# Patient Record
Sex: Male | Born: 1960 | Race: White | Hispanic: No | Marital: Married | Smoking: Never smoker
Health system: Southern US, Community
[De-identification: ages and names within clinical notes are randomized; demographics above are authoritative.]

## PROBLEM LIST (undated history)

## (undated) DIAGNOSIS — I10 Essential (primary) hypertension: Secondary | ICD-10-CM

## (undated) DIAGNOSIS — F4310 Post-traumatic stress disorder, unspecified: Secondary | ICD-10-CM

## (undated) DIAGNOSIS — Z974 Presence of external hearing-aid: Secondary | ICD-10-CM

## (undated) DIAGNOSIS — M1990 Unspecified osteoarthritis, unspecified site: Secondary | ICD-10-CM

## (undated) HISTORY — PX: COLONOSCOPY: SHX174

## (undated) HISTORY — PX: OTHER SURGICAL HISTORY: SHX169

## (undated) HISTORY — PX: TONSILLECTOMY: SUR1361

## (undated) HISTORY — PX: JOINT REPLACEMENT: SHX530

---

## 2017-03-10 ENCOUNTER — Emergency Department: Payer: Non-veteran care

## 2017-03-10 ENCOUNTER — Emergency Department
Admission: EM | Admit: 2017-03-10 | Discharge: 2017-03-10 | Disposition: A | Discharge status: Home / Self Care | Payer: Non-veteran care | Attending: Emergency Medicine | Admitting: Emergency Medicine

## 2017-03-10 ENCOUNTER — Encounter: Payer: Self-pay | Admitting: Emergency Medicine

## 2017-03-10 DIAGNOSIS — S46211A Strain of muscle, fascia and tendon of other parts of biceps, right arm, initial encounter: Secondary | ICD-10-CM | POA: Diagnosis not present

## 2017-03-10 DIAGNOSIS — I10 Essential (primary) hypertension: Secondary | ICD-10-CM | POA: Diagnosis not present

## 2017-03-10 DIAGNOSIS — Y9389 Activity, other specified: Secondary | ICD-10-CM | POA: Diagnosis not present

## 2017-03-10 DIAGNOSIS — Y929 Unspecified place or not applicable: Secondary | ICD-10-CM | POA: Diagnosis not present

## 2017-03-10 DIAGNOSIS — S46219A Strain of muscle, fascia and tendon of other parts of biceps, unspecified arm, initial encounter: Secondary | ICD-10-CM

## 2017-03-10 DIAGNOSIS — Y999 Unspecified external cause status: Secondary | ICD-10-CM | POA: Diagnosis not present

## 2017-03-10 DIAGNOSIS — W501XXA Accidental kick by another person, initial encounter: Secondary | ICD-10-CM | POA: Diagnosis not present

## 2017-03-10 DIAGNOSIS — S4991XA Unspecified injury of right shoulder and upper arm, initial encounter: Secondary | ICD-10-CM | POA: Diagnosis present

## 2017-03-10 DIAGNOSIS — Z96653 Presence of artificial knee joint, bilateral: Secondary | ICD-10-CM | POA: Diagnosis not present

## 2017-03-10 DIAGNOSIS — Z79899 Other long term (current) drug therapy: Secondary | ICD-10-CM | POA: Diagnosis not present

## 2017-03-10 HISTORY — DX: Essential (primary) hypertension: I10

## 2017-03-10 HISTORY — DX: Post-traumatic stress disorder, unspecified: F43.10

## 2017-03-10 MED ORDER — OXYCODONE-ACETAMINOPHEN 5-325 MG PO TABS: 1.0000 | ORAL_TABLET | ORAL | 0 refills | Status: DC | PRN

## 2017-03-10 NOTE — ED Notes (Signed)
See triage note  States playing with grand child this am  As kicked to left arm  Increased pain with movement  pain to anterior area  Positive pulses noted
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
## 2017-03-10 NOTE — Discharge Instructions (Signed)
If you have not heard from Dr. Binnie RailPoggi's office by this afternoon, please call his office. Return to the ER for any new symptoms of concern if unable to see orthopedics.

## 2017-03-10 NOTE — ED Notes (Signed)
Patient transported to MRI 

## 2017-03-10 NOTE — ED Notes (Signed)
Taken to MRI by EDT

## 2017-03-10 NOTE — ED Triage Notes (Signed)
left elbow injury.  Patient was wrestling with 56 year old grandson this morning when grandson did a "flying ninja kick".  Patient went to catch grandson and felt a "rip" to anterior medical elbow/ forearm area.  + CMS.  C/O increased pain with extension and any lifting.

## 2017-03-12 ENCOUNTER — Encounter: Payer: Self-pay | Admitting: *Deleted

## 2017-03-18 ENCOUNTER — Encounter
Admission: RE | Disposition: A | Discharge status: Home / Self Care | Payer: Self-pay | Source: Ambulatory Visit | Attending: Surgery

## 2017-03-18 ENCOUNTER — Ambulatory Visit: Payer: Non-veteran care | Admitting: Anesthesiology

## 2017-03-18 ENCOUNTER — Ambulatory Visit
Admission: RE | Admit: 2017-03-18 | Discharge: 2017-03-18 | Disposition: A | Discharge status: Home / Self Care | Payer: Non-veteran care | Source: Ambulatory Visit | Attending: Surgery | Admitting: Surgery

## 2017-03-18 ENCOUNTER — Encounter: Payer: Self-pay | Admitting: *Deleted

## 2017-03-18 DIAGNOSIS — Z79899 Other long term (current) drug therapy: Secondary | ICD-10-CM | POA: Insufficient documentation

## 2017-03-18 DIAGNOSIS — S46112A Strain of muscle, fascia and tendon of long head of biceps, left arm, initial encounter: Secondary | ICD-10-CM | POA: Diagnosis not present

## 2017-03-18 DIAGNOSIS — Z96653 Presence of artificial knee joint, bilateral: Secondary | ICD-10-CM | POA: Diagnosis not present

## 2017-03-18 DIAGNOSIS — Z791 Long term (current) use of non-steroidal anti-inflammatories (NSAID): Secondary | ICD-10-CM | POA: Diagnosis not present

## 2017-03-18 DIAGNOSIS — X501XXA Overexertion from prolonged static or awkward postures, initial encounter: Secondary | ICD-10-CM | POA: Diagnosis not present

## 2017-03-18 DIAGNOSIS — I10 Essential (primary) hypertension: Secondary | ICD-10-CM | POA: Insufficient documentation

## 2017-03-18 HISTORY — PX: DISTAL BICEPS TENDON REPAIR: SHX1461

## 2017-03-18 HISTORY — DX: Presence of external hearing-aid: Z97.4

## 2017-03-18 HISTORY — DX: Unspecified osteoarthritis, unspecified site: M19.90

## 2017-03-18 SURGERY — REPAIR, TENDON, BICEPS, DISTAL
Anesthesia: Monitor Anesthesia Care | Laterality: Left | Wound class: Clean

## 2017-03-18 MED ORDER — MIDAZOLAM HCL 5 MG/5ML IJ SOLN
INTRAMUSCULAR | Status: DC | PRN
  Administered 2017-03-18: 2 mg via INTRAVENOUS
  Administered 2017-03-18 (×2): 0.5 mg via INTRAVENOUS
  Administered 2017-03-18: 1 mg via INTRAVENOUS

## 2017-03-18 MED ORDER — OXYCODONE HCL 5 MG/5ML PO SOLN: 5.0000 mg | Freq: Once | ORAL | Status: DC | PRN

## 2017-03-18 MED ORDER — ACETAMINOPHEN 160 MG/5ML PO SOLN: 325.0000 mg | ORAL | Status: DC | PRN

## 2017-03-18 MED ORDER — FENTANYL CITRATE (PF) 100 MCG/2ML IJ SOLN: 25.0000 ug | INTRAMUSCULAR | Status: DC | PRN

## 2017-03-18 MED ORDER — PROMETHAZINE HCL 25 MG/ML IJ SOLN: 6.2500 mg | INTRAMUSCULAR | Status: DC | PRN

## 2017-03-18 MED ORDER — OXYCODONE HCL 5 MG PO TABS: 5.0000 mg | ORAL_TABLET | ORAL | 0 refills | Status: AC | PRN

## 2017-03-18 MED ORDER — ONDANSETRON HCL 4 MG/2ML IJ SOLN
INTRAMUSCULAR | Status: DC | PRN
  Administered 2017-03-18: 4 mg via INTRAVENOUS

## 2017-03-18 MED ORDER — EPHEDRINE SULFATE 50 MG/ML IJ SOLN
INTRAMUSCULAR | Status: DC | PRN
  Administered 2017-03-18: 10 mg via INTRAVENOUS
  Administered 2017-03-18 (×2): 5 mg via INTRAVENOUS
  Administered 2017-03-18 (×3): 10 mg via INTRAVENOUS

## 2017-03-18 MED ORDER — BUPIVACAINE HCL (PF) 0.5 % IJ SOLN
INTRAMUSCULAR | Status: DC | PRN
  Administered 2017-03-18: 10 mL

## 2017-03-18 MED ORDER — LACTATED RINGERS IV SOLN
10.0000 mL/h | INTRAVENOUS | Status: DC
  Administered 2017-03-18 (×2): via INTRAVENOUS
  Administered 2017-03-18: 10 mL/h via INTRAVENOUS

## 2017-03-18 MED ORDER — CEFAZOLIN SODIUM 1 G IJ SOLR
2000.0000 mg | Freq: Once | INTRAMUSCULAR | Status: AC
  Administered 2017-03-18: 2000 mg via INTRAVENOUS

## 2017-03-18 MED ORDER — ACETAMINOPHEN 325 MG PO TABS: 325.0000 mg | ORAL_TABLET | ORAL | Status: DC | PRN

## 2017-03-18 MED ORDER — PROPOFOL 500 MG/50ML IV EMUL
INTRAVENOUS | Status: DC | PRN
Start: 2017-03-18 — End: 2017-03-18
  Administered 2017-03-18: 120 ug/kg/min via INTRAVENOUS

## 2017-03-18 MED ORDER — FENTANYL CITRATE (PF) 100 MCG/2ML IJ SOLN
INTRAMUSCULAR | Status: DC | PRN
  Administered 2017-03-18: 100 ug via INTRAVENOUS

## 2017-03-18 MED ORDER — OXYCODONE HCL 5 MG PO TABS: 5.0000 mg | ORAL_TABLET | Freq: Once | ORAL | Status: DC | PRN

## 2017-03-18 MED ORDER — ROPIVACAINE HCL 5 MG/ML IJ SOLN
INTRAMUSCULAR | Status: DC | PRN
  Administered 2017-03-18: 30 mL via PERINEURAL

## 2017-03-18 SURGICAL SUPPLY — 39 items
BANDAGE ACE 4X5 VEL STRL LF (GAUZE/BANDAGES/DRESSINGS) ×6 IMPLANT
BNDG COHESIVE 4X5 TAN STRL (GAUZE/BANDAGES/DRESSINGS) ×3 IMPLANT
BNDG ESMARK 4X12 TAN STRL LF (GAUZE/BANDAGES/DRESSINGS) ×3 IMPLANT
CANISTER SUCT 1200ML W/VALVE (MISCELLANEOUS) ×3 IMPLANT
CHLORAPREP W/TINT 26ML (MISCELLANEOUS) ×3 IMPLANT
CLOSURE WOUND 1/2 X4 (GAUZE/BANDAGES/DRESSINGS) ×1
CUFF TOURN SGL QUICK 18 (TOURNIQUET CUFF) ×3 IMPLANT
CUFF TOURN SGL QUICK 24 (TOURNIQUET CUFF)
CUFF TRNQT CYL 24X4X40X1 (TOURNIQUET CUFF) IMPLANT
DRAPE FLUOR MINI C-ARM 54X84 (DRAPES) ×3 IMPLANT
DRAPE U-SHAPE 48X52 POLY STRL (PACKS) ×3 IMPLANT
ELECT REM PT RETURN 9FT ADLT (ELECTROSURGICAL) ×3
ELECTRODE REM PT RTRN 9FT ADLT (ELECTROSURGICAL) ×1 IMPLANT
GAUZE PETRO XEROFOAM 1X8 (MISCELLANEOUS) ×3 IMPLANT
GAUZE SPONGE 4X4 12PLY STRL (GAUZE/BANDAGES/DRESSINGS) ×3 IMPLANT
GLOVE BIO SURGEON STRL SZ8 (GLOVE) ×3 IMPLANT
GLOVE INDICATOR 8.0 STRL GRN (GLOVE) ×3 IMPLANT
GOWN STRL REUS W/ TWL LRG LVL3 (GOWN DISPOSABLE) ×1 IMPLANT
GOWN STRL REUS W/ TWL XL LVL3 (GOWN DISPOSABLE) ×1 IMPLANT
GOWN STRL REUS W/TWL LRG LVL3 (GOWN DISPOSABLE) ×2
GOWN STRL REUS W/TWL XL LVL3 (GOWN DISPOSABLE) ×2
IMPL TOGGLELOC ELBOW SYSTEM (Orthopedic Implant) ×1 IMPLANT
IMPLANT TOGGLELOC ELBOW SYSTEM (Orthopedic Implant) ×3 IMPLANT
KIT ROOM TURNOVER OR (KITS) ×3 IMPLANT
NS IRRIG 500ML POUR BTL (IV SOLUTION) ×3 IMPLANT
PACK EXTREMITY ARMC (MISCELLANEOUS) ×3 IMPLANT
PAD CAST CTTN 4X4 STRL (SOFTGOODS) ×1 IMPLANT
PADDING CAST COTTON 4X4 STRL (SOFTGOODS) ×2
SLING ARM LRG DEEP (SOFTGOODS) ×3 IMPLANT
SLING ARM M TX990204 (SOFTGOODS) IMPLANT
SLING ARM S TX990203 (SOFTGOODS) IMPLANT
SPLINT CAST 1 STEP 4X30 (MISCELLANEOUS) ×3 IMPLANT
STRIP CLOSURE SKIN 1/2X4 (GAUZE/BANDAGES/DRESSINGS) ×2 IMPLANT
SUT ORTHOCORD OS-6 NDL 36 (SUTURE) ×3 IMPLANT
SUT VIC AB 2-0 SH 27 (SUTURE) ×2
SUT VIC AB 2-0 SH 27XBRD (SUTURE) ×1 IMPLANT
SUT VIC AB 3-0 SH 27 (SUTURE) ×2
SUT VIC AB 3-0 SH 27X BRD (SUTURE) ×1 IMPLANT
SWABSTK COMLB BENZOIN TINCTURE (MISCELLANEOUS) ×3 IMPLANT

## 2017-03-18 NOTE — Anesthesia Preprocedure Evaluation (Signed)
Anesthesia Evaluation  Patient identified by MRN, date of birth, ID band Patient awake    Reviewed: Allergy & Precautions, NPO status , Unable to perform ROS - Chart review only  History of Anesthesia Complications (+) PONV  Airway Mallampati: I  TM Distance: >3 FB Neck ROM: Full    Dental  (+) Teeth Intact   Pulmonary neg pulmonary ROS,    breath sounds clear to auscultation       Cardiovascular hypertension,  Rhythm:Regular Rate:Normal     Neuro/Psych PSYCHIATRIC DISORDERS (PTSD) negative neurological ROS     GI/Hepatic   Endo/Other    Renal/GU      Musculoskeletal  (+) Arthritis ,   Abdominal   Peds  Hematology   Anesthesia Other Findings   Reproductive/Obstetrics                            Anesthesia Physical Anesthesia Plan  ASA: II  Anesthesia Plan: Regional and MAC   Post-op Pain Management:    Induction:   PONV Risk Score and Plan: 2 and Ondansetron  Airway Management Planned:   Additional Equipment:   Intra-op Plan:   Post-operative Plan:   Informed Consent: I have reviewed the patients History and Physical, chart, labs and discussed the procedure including the risks, benefits and alternatives for the proposed anesthesia with the patient or authorized representative who has indicated his/her understanding and acceptance.     Plan Discussed with: Anesthesiologist  Anesthesia Plan Comments:         Anesthesia Quick Evaluation

## 2017-03-18 NOTE — Anesthesia Postprocedure Evaluation (Signed)
Anesthesia Post Note  Patient: Ethan Dominguez  Procedure(s) Performed: Procedure(s) (LRB): LEFT DISTAL BICEPS REPAIR (Left)  Patient location during evaluation: PACU Anesthesia Type: Regional and MAC Level of consciousness: awake and alert Pain management: pain level controlled Vital Signs Assessment: post-procedure vital signs reviewed and stable Respiratory status: spontaneous breathing, nonlabored ventilation and respiratory function stable Cardiovascular status: stable Anesthetic complications: no    Veda Canning

## 2017-03-18 NOTE — H&P (Signed)
Paper H&P to be scanned into permanent record. H&P reviewed and patient re-examined. No changes. 

## 2017-03-18 NOTE — Discharge Instructions (Signed)
General Anesthesia, Adult, Care After These instructions provide you with information about caring for yourself after your procedure. Your health care provider may also give you more specific instructions. Your treatment has been planned according to current medical practices, but problems sometimes occur. Call your health care provider if you have any problems or questions after your procedure. What can I expect after the procedure? After the procedure, it is common to have:  Vomiting.  A sore throat.  Mental slowness.  It is common to feel:  Nauseous.  Cold or shivery.  Sleepy.  Tired.  Sore or achy, even in parts of your body where you did not have surgery.  Follow these instructions at home: For at least 24 hours after the procedure:  Do not: ? Participate in activities where you could fall or become injured. ? Drive. ? Use heavy machinery. ? Drink alcohol. ? Take sleeping pills or medicines that cause drowsiness. ? Make important decisions or sign legal documents. ? Take care of children on your own.  Rest. Eating and drinking  If you vomit, drink water, juice, or soup when you can drink without vomiting.  Drink enough fluid to keep your urine clear or pale yellow.  Make sure you have little or no nausea before eating solid foods.  Follow the diet recommended by your health care provider. General instructions  Have a responsible adult stay with you until you are awake and alert.  Return to your normal activities as told by your health care provider. Ask your health care provider what activities are safe for you.  Take over-the-counter and prescription medicines only as told by your health care provider.  If you smoke, do not smoke without supervision.  Keep all follow-up visits as told by your health care provider. This is important. Contact a health care provider if:  You continue to have nausea or vomiting at home, and medicines are not helpful.  You  cannot drink fluids or start eating again.  You cannot urinate after 8-12 hours.  You develop a skin rash.  You have fever.  You have increasing redness at the site of your procedure. Get help right away if:  You have difficulty breathing.  You have chest pain.  You have unexpected bleeding.  You feel that you are having a life-threatening or urgent problem. This information is not intended to replace advice given to you by your health care provider. Make sure you discuss any questions you have with your health care provider. Document Released: 01/05/2001 Document Revised: 03/03/2016 Document Reviewed: 09/13/2015 Elsevier Interactive Patient Education  2018 [employer]  Keep splint dry and intact. Keep hand elevated above heart level as much as possible. Apply ice to affected area frequently. Take ibuprofen 800 mg TID with meals for 7-10 days, then as necessary. Take pain medication as prescribed or ES Tylenol when needed.  Return for follow-up in 10-14 days or as scheduled.

## 2017-03-18 NOTE — Anesthesia Procedure Notes (Signed)
Procedure Name: MAC Performed by: Ashad Fawbush Pre-anesthesia Checklist: Patient identified, Emergency Drugs available, Suction available, Patient being monitored and Timeout performed Patient Re-evaluated:Patient Re-evaluated prior to induction      

## 2017-03-18 NOTE — Progress Notes (Signed)
Assisted Dr.Elsje with left, ultrasound guided, supraclavicular block. Side rails up, monitors on throughout procedure. See vital signs in flow sheet. Tolerated Procedure well.

## 2017-03-18 NOTE — Op Note (Signed)
03/18/2017  12:38 PM  Patient:   Ethan Dominguez  Pre-Op Diagnosis:   Distal biceps tendon rupture, left elbow.  Post-Op Diagnosis:   Same.  Procedure:   Primary repair of ruptured distal biceps tendon, left elbow.  Surgeon:   Maryagnes Amos, MD  Assistant:   None  Anesthesia:   IV sedation with interscalene block  Findings:   As above.  Complications:   None  EBL:   5 cc  Fluids:   1400 cc crystalloid  TT:   69 minutes at 250 mmHg  Drains:   None  Closure:   3-0 Vicryl subcuticular sutures  Implants:   Biomet ToggleLoc 1  Brief Clinical Note:   The patient is a 56 year old male who sustained the above-noted injury last week when he was playing with his grandson. Apparently the grandson try to do some sort of "Ninja Turtle" kick and the patient caught him, injuring his elbow in the process. Subsequent workup, including MRI scan, confirmed the presence of displaced rupture of his left distal biceps tendon. The patient presents this time for repair of his ruptured left distal biceps tendon.  Procedure:   The patient underwent placement of an interscalene block in the preoperative holding area before he was brought into the operating room and lain in the supine position. After adequate IV sedation was achieved, a timeout was performed to verify the appropriate surgical site. The left upper extremity and hand were prepped with DuraPrep solution before being draped sterilely. Preoperative antibiotics were administered. The limb was exsanguinated with an Esmarch and the tourniquet inflated to 250 mmHg. A curvilinear incision was made over the antecubital fossa. The incision was carried down through the subcutaneous cutaneous tissues with care taken to identify and protect the neurovascular structures in the area. Using blunt dissection, the distal biceps tendon was identified. The distal portion of the tendon was debrided of degenerative tissues before a #2 OrthoSorb suture was  placed through the tendon in a Krakw type suture technique, incorporating the loop of the Biomet ToggleLoc device. Tension was applied to the suture to be sure that it would not slip later.  Attention was directed distally. The bicipital sheath was followed down to the proximal radius using blunt dissection. With care, the bicipital tuberosity was identified by palpation and then dissected free of adjacent soft tissue. A guidewire was placed and its position verified fluoroscopically in AP and lateral projections. The guidewire was overreamed with an 8 mm acorn reamer through the anterior cortex before the posterior cortex was overreamed with a 4.5 mm reamer. The passing sutures of the ToggleLoc anchor were passed through the eye of the Beath needle and the needle brought out posteriorly, pulling the anchor through the radius. It was "set" before the tendon was cinched into the socket with the elbow flexed to 90. The adequacy of anchor position was verified fluoroscopically in AP and lateral projections and found to be satisfactory. The anterior sutures of the ToggleLoc device were cut short, while the suture pulling the anchor through the radius posteriorly also was removed. The repair was deemed to be stable to within 20 of extension.  The wound was copiously irrigated with sterile saline solution before the subcutaneous tissues were closed with 2-0 Vicryl interrupted sutures. 3-0 Vicryl subcuticular sutures were used to close the skin before Benzoin and steri-strips were applied to the skin. A sterile bulky dressing was applied to the elbow before the arm was placed into a posterior splint maintaining the  elbow at approximately 90 of flexion and in neutral rotation. The patient was then awakened and returned to the recovery room in satisfactory condition after tolerating the procedure well.

## 2017-03-18 NOTE — Anesthesia Procedure Notes (Addendum)
Anesthesia Regional Block: Supraclavicular block   Pre-Anesthetic Checklist: ,, timeout performed, Correct Patient, Correct Site, Correct Laterality, Correct Procedure, Correct Position, site marked, Risks and benefits discussed,  Surgical consent,  Pre-op evaluation,  At surgeon's request and post-op pain management  Laterality: Left  Prep: chloraprep       Needles:  Injection technique: Single-shot  Needle Type: Stimiplex     Needle Length: 10cm  Needle Gauge: 21     Additional Needles:   Procedures: ultrasound guided,,,,,,,,  Narrative:  Start time: 03/18/2017 10:02 AM End time: 03/18/2017 10:07 AM Injection made incrementally with aspirations every 5 mL.  Performed by: Personally  Anesthesiologist: Jola BabinskiHARKER, Ziyana Morikawa  Additional Notes: No events. Dosed incrementally. Patient tolerated well. Jola BabinskiElsje Irvin Bastin, MD

## 2017-03-18 NOTE — Transfer of Care (Signed)
Immediate Anesthesia Transfer of Care Note  Patient: Ethan Dominguez  Procedure(s) Performed: Procedure(s): LEFT DISTAL BICEPS REPAIR (Left)  Patient Location: PACU  Anesthesia Type: Regional, MAC  Level of Consciousness: awake, alert  and patient cooperative  Airway and Oxygen Therapy: Patient Spontanous Breathing and Patient connected to supplemental oxygen  Post-op Assessment: Post-op Vital signs reviewed, Patient's Cardiovascular Status Stable, Respiratory Function Stable, Patent Airway and No signs of Nausea or vomiting  Post-op Vital Signs: Reviewed and stable  Complications: No apparent anesthesia complications

## 2017-03-19 ENCOUNTER — Encounter: Payer: Self-pay | Admitting: Surgery

## 2017-03-20 ENCOUNTER — Encounter: Payer: Self-pay | Admitting: Surgery

## 2017-03-20 NOTE — Addendum Note (Signed)
Addendum  created 03/20/17 [id number] by Orlin HildingLeblanc, Ashten Sarnowski, CRNA   Anesthesia Event deleted, Anesthesia Event edited
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
## 2020-01-01 ENCOUNTER — Ambulatory Visit: Payer: Non-veteran care | Attending: Internal Medicine

## 2020-01-01 DIAGNOSIS — Z23 Encounter for immunization: Secondary | ICD-10-CM

## 2020-01-01 NOTE — Progress Notes (Signed)
   Covid-19 Vaccination Clinic  Name:  JEET SHOUGH    MRN: [id number] DOB: [birth year]  01/01/2020  Mr. Gulas was observed post Covid-19 immunization for 15 minutes without incident. He was provided with Vaccine Information Sheet and instruction to access the V-Safe system.   Mr. Mathews was instructed to call 911 with any severe reactions post vaccine: Marland Kitchen Difficulty breathing  . Swelling of face and throat  . A fast heartbeat  . A bad rash all over body  . Dizziness and weakness   Immunizations Administered    Name Date Dose VIS Date Route   Pfizer COVID-19 Vaccine 01/01/2020  4:46 PM 0.3 mL 09/23/2019 Intramuscular   Manufacturer: [employer], [employer]   Lot: [id number]   NDC: [address]-1

## 2020-01-22 ENCOUNTER — Ambulatory Visit: Payer: Non-veteran care | Attending: Internal Medicine

## 2020-01-22 DIAGNOSIS — Z23 Encounter for immunization: Secondary | ICD-10-CM

## 2020-01-22 NOTE — Progress Notes (Signed)
   Covid-19 Vaccination Clinic  Name:  Ethan Dominguez    MRN: [id number] DOB: 04/16/1961  01/22/2020  Ethan Dominguez was observed post Covid-19 immunization for 15 minutes without incident. He was provided with Vaccine Information Sheet and instruction to access the V-Safe system.   Ethan Dominguez was instructed to call 911 with any severe reactions post vaccine: Marland Kitchen Difficulty breathing  . Swelling of face and throat  . A fast heartbeat  . A bad rash all over body  . Dizziness and weakness   Immunizations Administered    Name Date Dose VIS Date Route   Pfizer COVID-19 Vaccine 01/22/2020  4:02 PM 0.3 mL 09/23/2019 Intramuscular   Manufacturer: [employer], [employer]   Lot: [id number]   NDC: [address]-0
